# Patient Record
Sex: Female | Born: 2000 | Race: White | Hispanic: No | Marital: Single | Smoking: Never smoker
Health system: Southern US, Community
[De-identification: ages and names within clinical notes are randomized; demographics above are authoritative.]

## PROBLEM LIST (undated history)

## (undated) DIAGNOSIS — R51 Headache: Secondary | ICD-10-CM

## (undated) DIAGNOSIS — N390 Urinary tract infection, site not specified: Secondary | ICD-10-CM

## (undated) DIAGNOSIS — N289 Disorder of kidney and ureter, unspecified: Secondary | ICD-10-CM

## (undated) DIAGNOSIS — J302 Other seasonal allergic rhinitis: Secondary | ICD-10-CM

## (undated) HISTORY — DX: Headache: R51

---
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
## 2020-09-03 ENCOUNTER — Encounter: Payer: Self-pay | Admitting: Family Medicine

## 2020-09-03 NOTE — Progress Notes (Signed)
   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  She has had persistent pain at the base of her toenail and her toenail has turned black.  She feels like her toenail is lifting up whenever she stops and pivots and hits her toe against the end of her shoe.  It is becoming progressively more painful.  It turned black about a week ago, and due to the pain she would like something to be done.  We have already tried taping, cushioning the great toe.  She did have a recent change in shoes.  No numbness or tingling, no fevers, chills.  Review of Systems:  Per HPI.   PMFSH, medications and smoking status reviewed.      Objective:  Physical Exam:  No flowsheet data found.   Gen: awake, alert, NAD, comfortable in exam room Pulm: breathing unlabored  Great toe, right: Inspection: The toenail is slightly lifted, there is subungual hematoma underneath the entirety of the nail, she also does have some surrounding erythema at the base of the toenail. Palpation: Very tender to palpation especially at the base of the nail, but somewhat diffusely tender. ROM: Full range of motion of the great toe Strength: Full strength with resisted flexion extension of the great toe    Assessment & Plan:  1.  Injury to the great toenail  Patient with persistent pain and subungual hematoma.  The nail is already loose and I suspect that it has already died and there is a new nail growing underneath causing worsening pain.  We discussed options and the patient elected to proceed with great toe nail avulsion which was performed in the training room without complication.  Procedure note: Patient's clinical status is marked by significant pain and functional disability.  Other conservative therapies did not provide adequate relief or not indicated.   Risks and benefits of toenail avulsion were discussed with patient and she provided verbal consent to proceed with the procedure.  I also discussed the procedure with her mother who gave verbal consent.  Patient was seated on examination table with her foot planted on the examination table.  A sterile field was assembled, she was cleaned with Betadine, and then anesthetized with approximately 5 cc total of lidocaine injected at the base of the great toe.  After ensuring anesthetization, the toenail was grasped with hemostats, and undermined.  After the toenail was fully separated from the nailbed, traction was applied until the toenail avulsed.  There is a very small amount of bleeding which resolved with pressure.  Pressure dressing was applied and instructions for the next couple days were discussed with patient.  She tolerated procedure well.   Guy Sandifer, MD [facility] Fellow 09/03/2020 9:30 AM

## 2023-12-16 ENCOUNTER — Emergency Department (HOSPITAL_BASED_OUTPATIENT_CLINIC_OR_DEPARTMENT_OTHER)

## 2023-12-16 ENCOUNTER — Other Ambulatory Visit: Payer: Self-pay

## 2023-12-16 ENCOUNTER — Emergency Department (HOSPITAL_BASED_OUTPATIENT_CLINIC_OR_DEPARTMENT_OTHER)
Admission: EM | Admit: 2023-12-16 | Discharge: 2023-12-16 | Disposition: A | Discharge status: Home / Self Care | Attending: Emergency Medicine | Admitting: Emergency Medicine

## 2023-12-16 ENCOUNTER — Encounter (HOSPITAL_BASED_OUTPATIENT_CLINIC_OR_DEPARTMENT_OTHER): Payer: Self-pay | Admitting: Emergency Medicine

## 2023-12-16 DIAGNOSIS — R300 Dysuria: Secondary | ICD-10-CM | POA: Diagnosis not present

## 2023-12-16 DIAGNOSIS — R109 Unspecified abdominal pain: Secondary | ICD-10-CM | POA: Insufficient documentation

## 2023-12-16 HISTORY — DX: Disorder of kidney and ureter, unspecified: N28.9

## 2023-12-16 LAB — URINALYSIS, ROUTINE W REFLEX MICROSCOPIC
Bilirubin Urine: NEGATIVE
Glucose, UA: NEGATIVE mg/dL
Hgb urine dipstick: NEGATIVE
Ketones, ur: NEGATIVE mg/dL
Nitrite: NEGATIVE
Protein, ur: NEGATIVE mg/dL
Specific Gravity, Urine: 1.026 (ref 1.005–1.030)
pH: 6 (ref 5.0–8.0)

## 2023-12-16 LAB — PREGNANCY, URINE: Preg Test, Ur: NEGATIVE

## 2023-12-16 MED ORDER — HYDROMORPHONE HCL 1 MG/ML IJ SOLN
0.5000 mg | Freq: Once | INTRAMUSCULAR | Status: AC
  Administered 2023-12-16: 0.5 mg via INTRAVENOUS
  Filled 2023-12-16: qty 1

## 2023-12-16 MED ORDER — ONDANSETRON HCL 4 MG/2ML IJ SOLN
4.0000 mg | Freq: Once | INTRAMUSCULAR | Status: AC
  Administered 2023-12-16: 4 mg via INTRAVENOUS
  Filled 2023-12-16: qty 2

## 2023-12-16 NOTE — ED Triage Notes (Signed)
 Pt via pov from home with right flank pain and dysuria that started last night. Pt has hx of kidney stones and this feels similar. Pt reports one episode of emesis this morning. Pt alert & oriented, nad noted.

## 2023-12-16 NOTE — ED Provider Notes (Signed)
 [place] EMERGENCY DEPARTMENT AT [facility] Provider Note   CSN: [id number] Arrival date & time: 12/16/23  1144     History  Chief Complaint  Patient presents with   Flank Pain    Debra Lindsey is a 23 y.o. female.  Patient complains of right sided flank pain.  Patient reports that she is experiencing discomfort with urination.  Patient reports that she had similar symptoms to this in the past and was diagnosed with a kidney stone.  Patient reports this is the same pain and the same discomfort.  Patient noted seeing some blood in her urine earlier.  Patient denies any fever or chills she has had nausea and vomiting no diarrhea.  Patient denies any medical problems.   Flank Pain       Home Medications Prior to Admission medications   Medication Sig Start Date End Date Taking? Authorizing Provider  antipyrine-benzocaine Lyla Son) otic solution Place 3 drops into both ears every 2 (two) hours as needed for pain. Patient not taking: Reported on 12/16/2023 09/02/12   Preston Fleeting, MD  cephALEXin (KEFLEX) 500 MG capsule Take 2 capsules (1,000 mg total) by mouth 2 (two) times daily. 06/27/17   Arby Barrette, MD  cetirizine (ZYRTEC) 10 MG tablet Take 10 mg by mouth daily.    [provider]  ibuprofen (ADVIL,MOTRIN) 200 MG tablet Take 2 tablets (400 mg total) by mouth every 6 (six) hours as needed for moderate pain or cramping. For pain 09/10/14   Fayrene Helper, PA-C  naproxen (NAPROSYN) 500 MG tablet Take 1 tablet (500 mg total) by mouth 2 (two) times daily. 06/27/17   Arby Barrette, MD  NON FORMULARY     [provider]  ondansetron (ZOFRAN) 4 MG tablet Take 1 tablet (4 mg total) by mouth every 8 (eight) hours as needed for nausea or vomiting. 09/10/14   Fayrene Helper, PA-C  phenazopyridine (PYRIDIUM) 200 MG tablet Take 1 tablet (200 mg total) by mouth 3 (three) times daily as needed for pain. 06/27/17   Arby Barrette, MD      Allergies     Patient has no known allergies.    Review of Systems   Review of Systems  Genitourinary:  Positive for flank pain.  All other systems reviewed and are negative.   Physical Exam Updated Vital Signs BP 127/78 (BP Location: Right Arm)   Pulse 69   Temp (!) 97.5 F (36.4 C)   Resp 18   Ht 5\' 10"  (1.778 m)   Wt 74.8 kg   LMP 11/20/2023 (Approximate)   SpO2 100%   BMI 23.68 kg/m  Physical Exam Vitals and nursing note reviewed.  Constitutional:      Appearance: She is well-developed.  HENT:     Head: Normocephalic.  Cardiovascular:     Rate and Rhythm: Normal rate.  Pulmonary:     Effort: Pulmonary effort is normal.  Abdominal:     General: Abdomen is flat. There is no distension.     Palpations: Abdomen is soft.     Comments: Abdomen soft nontender,   Musculoskeletal:        General: Normal range of motion.     Cervical back: Normal range of motion.  Skin:    General: Skin is warm.  Neurological:     General: No focal deficit present.     Mental Status: She is alert and oriented to person, place, and time.  Psychiatric:        Mood  and Affect: Mood normal.     ED Results / Procedures / Treatments   Labs (all labs ordered are listed, but only abnormal results are displayed) Labs Reviewed  URINALYSIS, ROUTINE W REFLEX MICROSCOPIC - Abnormal; Notable for the following components:      Result Value   Leukocytes,Ua TRACE (*)    Bacteria, UA RARE (*)    All other components within normal limits  PREGNANCY, URINE    EKG None  Radiology CT Renal Stone Study Result Date: 12/16/2023 CLINICAL DATA:  Abdominal/flank pain, stone suspected. EXAM: CT ABDOMEN AND PELVIS WITHOUT CONTRAST TECHNIQUE: Multidetector CT imaging of the abdomen and pelvis was performed following the standard protocol without IV contrast. RADIATION DOSE REDUCTION: This exam was performed according to the departmental dose-optimization program which includes automated exposure control, adjustment  of the mA and/or kV according to patient size and/or use of iterative reconstruction technique. COMPARISON:  CT scan renal stone protocol from 06/27/2017. FINDINGS: Lower chest: The lung bases are clear. No pleural effusion. The heart is normal in size. No pericardial effusion. Hepatobiliary: The liver is normal in size. Non-cirrhotic configuration. No suspicious mass. No intrahepatic or extrahepatic bile duct dilation. No calcified gallstones. Normal gallbladder wall thickness. No pericholecystic inflammatory changes. Pancreas: Unremarkable. No pancreatic ductal dilatation or surrounding inflammatory changes. Spleen: Within normal limits. No focal lesion. Adrenals/Urinary Tract: Adrenal glands are unremarkable. No suspicious renal mass. No hydronephrosis. No renal or ureteric calculi. Unremarkable urinary bladder. Stomach/Bowel: No disproportionate dilation of the small or large bowel loops. No evidence of abnormal bowel wall thickening or inflammatory changes. The appendix is unremarkable. Vascular/Lymphatic: No ascites or pneumoperitoneum. No abdominal or pelvic lymphadenopathy, by size criteria. No aneurysmal dilation of the major abdominal arteries. Reproductive: The uterus is unremarkable. The ovaries are unremarkable. Other: The visualized soft tissues and abdominal wall are unremarkable. Musculoskeletal: No suspicious osseous lesions. IMPRESSION: *No nephroureterolithiasis or obstructive uropathy. *No acute inflammatory process identified within the abdomen or pelvis. Electronically Signed   By: Jules Schick M.D.   On: 12/16/2023 15:37    Procedures Procedures    Medications Ordered in ED Medications  ondansetron (ZOFRAN) injection 4 mg (4 mg Intravenous Given 12/16/23 1441)  HYDROmorphone (DILAUDID) injection 0.5 mg (0.5 mg Intravenous Given 12/16/23 1442)    ED Course/ Medical Decision Making/ A&P                                 Medical Decision Making Patient complains of right flank  pain and burning with urination.  Amount and/or Complexity of Data Reviewed Labs: ordered. Decision-making details documented in ED Course.    Details: Urine pregnancy is negative UA shows trace bacteria otherwise negative Radiology: ordered.    Details: CT renal no evidence of kidney stone appendix is normal bowel is normal.  Risk Prescription drug management. Risk Details: Patient counseled on symptoms advised Tylenol or ibuprofen follow-up with primary care physician for recheck.           Final Clinical Impression(s) / ED Diagnoses Final diagnoses:  Dysuria  Acute right flank pain    Rx / DC Orders ED Discharge Orders     None      An After Visit Summary was printed and given to the patient.    Elson Areas, PA-C 12/16/23 1639    Franne Forts, DO 12/17/23 1544

## 2023-12-16 NOTE — Discharge Instructions (Addendum)
Return if any problems. See your Physician for recheck next week  

## 2023-12-16 NOTE — ED Notes (Signed)
 Return from CT
